# Patient Record
Sex: Female | Born: 2005 | Race: Black or African American | Hispanic: No | Marital: Single | Smoking: Never smoker
Health system: Southern US, Community
[De-identification: ages and names within clinical notes are randomized; demographics above are authoritative.]

## PROBLEM LIST (undated history)

## (undated) DIAGNOSIS — J302 Other seasonal allergic rhinitis: Secondary | ICD-10-CM

## (undated) DIAGNOSIS — J45909 Unspecified asthma, uncomplicated: Secondary | ICD-10-CM

## (undated) HISTORY — PX: BREAST SURGERY: SHX581

---
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
## 2024-01-22 ENCOUNTER — Emergency Department (HOSPITAL_BASED_OUTPATIENT_CLINIC_OR_DEPARTMENT_OTHER)

## 2024-01-22 ENCOUNTER — Other Ambulatory Visit: Payer: Self-pay

## 2024-01-22 ENCOUNTER — Emergency Department (HOSPITAL_BASED_OUTPATIENT_CLINIC_OR_DEPARTMENT_OTHER)
Admission: EM | Admit: 2024-01-22 | Discharge: 2024-01-22 | Disposition: A | Discharge status: Home / Self Care | Attending: Emergency Medicine | Admitting: Emergency Medicine

## 2024-01-22 DIAGNOSIS — R109 Unspecified abdominal pain: Secondary | ICD-10-CM | POA: Diagnosis present

## 2024-01-22 DIAGNOSIS — K5904 Chronic idiopathic constipation: Secondary | ICD-10-CM | POA: Diagnosis not present

## 2024-01-22 DIAGNOSIS — R1031 Right lower quadrant pain: Secondary | ICD-10-CM

## 2024-01-22 LAB — CBC WITH DIFFERENTIAL/PLATELET
Abs Immature Granulocytes: 0.02 K/uL (ref 0.00–0.07)
Basophils Absolute: 0 K/uL (ref 0.0–0.1)
Basophils Relative: 1 %
Eosinophils Absolute: 0.1 K/uL (ref 0.0–0.5)
Eosinophils Relative: 1 %
HCT: 41.1 % (ref 36.0–46.0)
Hemoglobin: 13.5 g/dL (ref 12.0–15.0)
Immature Granulocytes: 0 %
Lymphocytes Relative: 32 %
Lymphs Abs: 2.6 K/uL (ref 0.7–4.0)
MCH: 27.3 pg (ref 26.0–34.0)
MCHC: 32.8 g/dL (ref 30.0–36.0)
MCV: 83.2 fL (ref 80.0–100.0)
Monocytes Absolute: 0.8 K/uL (ref 0.1–1.0)
Monocytes Relative: 10 %
Neutro Abs: 4.7 K/uL (ref 1.7–7.7)
Neutrophils Relative %: 56 %
Platelets: 359 K/uL (ref 150–400)
RBC: 4.94 MIL/uL (ref 3.87–5.11)
RDW: 13.2 % (ref 11.5–15.5)
WBC: 8.3 K/uL (ref 4.0–10.5)
nRBC: 0 % (ref 0.0–0.2)

## 2024-01-22 LAB — URINALYSIS, ROUTINE W REFLEX MICROSCOPIC
Bilirubin Urine: NEGATIVE
Glucose, UA: NEGATIVE mg/dL
Hgb urine dipstick: NEGATIVE
Nitrite: NEGATIVE
Specific Gravity, Urine: 1.029 (ref 1.005–1.030)
pH: 6 (ref 5.0–8.0)

## 2024-01-22 LAB — COMPREHENSIVE METABOLIC PANEL WITH GFR
ALT: 20 U/L (ref 0–44)
AST: 18 U/L (ref 15–41)
Albumin: 4.3 g/dL (ref 3.5–5.0)
Alkaline Phosphatase: 63 U/L (ref 38–126)
Anion gap: 14 (ref 5–15)
BUN: 17 mg/dL (ref 6–20)
CO2: 22 mmol/L (ref 22–32)
Calcium: 9.6 mg/dL (ref 8.9–10.3)
Chloride: 100 mmol/L (ref 98–111)
Creatinine, Ser: 0.76 mg/dL (ref 0.44–1.00)
GFR, Estimated: >60 mL/min (ref >60)
Glucose, Bld: 79 mg/dL (ref 70–99)
Potassium: 3.8 mmol/L (ref 3.5–5.1)
Sodium: 136 mmol/L (ref 135–145)
Total Bilirubin: 0.3 mg/dL (ref 0.0–1.2)
Total Protein: 8.2 g/dL — ABNORMAL HIGH (ref 6.5–8.1)

## 2024-01-22 LAB — URINE DRUG SCREEN
Amphetamines: NEGATIVE
Barbiturates: NEGATIVE
Benzodiazepines: NEGATIVE
Cocaine: NEGATIVE
Fentanyl: NEGATIVE
Methadone Scn, Ur: NEGATIVE
Opiates: NEGATIVE
Tetrahydrocannabinol: NEGATIVE

## 2024-01-22 LAB — ETHANOL: Alcohol, Ethyl (B): <15 mg/dL (ref <15)

## 2024-01-22 LAB — HCG, SERUM, QUALITATIVE: Preg, Serum: NEGATIVE

## 2024-01-22 MED ORDER — MORPHINE SULFATE (PF) 2 MG/ML IV SOLN
2.0000 mg | Freq: Once | INTRAVENOUS | Status: AC
  Administered 2024-01-22: 2 mg via INTRAVENOUS
  Filled 2024-01-22: qty 1

## 2024-01-22 MED ORDER — ONDANSETRON HCL 4 MG/2ML IJ SOLN
4.0000 mg | Freq: Once | INTRAMUSCULAR | Status: AC
  Administered 2024-01-22: 4 mg via INTRAVENOUS
  Filled 2024-01-22: qty 2

## 2024-01-22 MED ORDER — SODIUM CHLORIDE 0.9 % IV BOLUS
1000.0000 mL | Freq: Once | INTRAVENOUS | Status: AC
  Administered 2024-01-22: 1000 mL via INTRAVENOUS

## 2024-01-22 MED ORDER — IOHEXOL 300 MG/ML  SOLN
100.0000 mL | Freq: Once | INTRAMUSCULAR | Status: AC | PRN
  Administered 2024-01-22: 100 mL via INTRAVENOUS

## 2024-01-22 NOTE — ED Notes (Signed)
 Pt given phone charger property of ED. Instructed to return upon D/C.

## 2024-01-22 NOTE — ED Triage Notes (Signed)
 Pt POV reporting lower abd pain and multiple episodes of emesis since yesterday. Last BM 2 days ago.

## 2024-01-22 NOTE — Discharge Instructions (Signed)
 You were seen in the ER today for concerns of abdominal pain. Your labs and imaging were thankfully reassuring with no abnormal findings seen to explain your current pain. Your CT scan did appear to show a somewhat prominent amount of stool on the right lower side of your abdomen which may be why you are having pain in this area. I would suggest taking Miralax daily (one capful) for the next 7 days until bowel movements become more regular and there is improvement in the pain in this area. Afterwards, add in a daily fiber supplement or increase your dietary fiber. For any concerns of worsening symptoms, return to the ER.

## 2024-01-22 NOTE — ED Provider Notes (Signed)
 [place] [facility]PARTMENT AT [facility] Provider Note   CSN: [id number] Arrival date & time: 01/22/24  1535     Patient presents with: Abdominal Pain   Sylvia Oneal is a 18 y.o. female. Patient presents to the ED with concerns of abdominal pain. Reports symptoms ongoing for several weeks with intermittent episodes of vomiting with reported blood streaking in her vomit yesterday. States last bowel movement was two days ago and no recent constipation or diarrhea.   Abdominal Pain      Prior to Admission medications   Medication Sig Start Date End Date Taking? Authorizing Provider  albuterol  (VENTOLIN  HFA) 108 (90 Base) MCG/ACT inhaler Inhale into the lungs. 08/13/21   [provider]  cetirizine (ZYRTEC) 10 MG tablet Take by mouth. 08/13/21   [provider]  clindamycin (CLINDAGEL) 1 % gel Apply to acne bumps on the face once a day and on the boils 2 times a day. 02/14/22   [provider]  doxycycline  (VIBRAMYCIN ) 100 MG capsule Take 1 capsule (100 mg total) by mouth 2 (two) times daily. 11/03/23   Jerrol Agent, MD  ondansetron  (ZOFRAN -ODT) 4 MG disintegrating tablet Take 1 tablet (4 mg total) by mouth every 8 (eight) hours as needed for nausea or vomiting. 09/25/23   Chandra Harlene LABOR, NP    Allergies: Patient has no known allergies.    Review of Systems  Gastrointestinal:  Positive for abdominal pain.  All other systems reviewed and are negative.   Updated Vital Signs BP 92/75   Pulse 89   Temp 98 F (36.7 C) (Oral)   Resp 17   Ht 5' 2 (1.575 m)   LMP 12/06/2023 (Approximate)   SpO2 100%   BMI 31.49 kg/m   Physical Exam Vitals and nursing note reviewed.  Constitutional:      General: She is not in acute distress.    Appearance: She is well-developed.  HENT:     Head: Normocephalic and atraumatic.  Eyes:     Conjunctiva/sclera: Conjunctivae normal.  Cardiovascular:     Rate and Rhythm: Normal rate and regular  rhythm.     Heart sounds: No murmur heard. Pulmonary:     Effort: Pulmonary effort is normal. No respiratory distress.     Breath sounds: Normal breath sounds.  Abdominal:     Palpations: Abdomen is soft.     Tenderness: There is abdominal tenderness in the right lower quadrant. There is guarding. There is no right CVA tenderness, left CVA tenderness or rebound. Negative signs include Murphy's sign.  Musculoskeletal:        General: No swelling.     Cervical back: Neck supple.  Skin:    General: Skin is warm and dry.     Capillary Refill: Capillary refill takes less than 2 seconds.  Neurological:     Mental Status: She is alert.  Psychiatric:        Mood and Affect: Mood normal.     (all labs ordered are listed, but only abnormal results are displayed) Labs Reviewed  COMPREHENSIVE METABOLIC PANEL WITH GFR - Abnormal; Notable for the following components:      Result Value   Total Protein 8.2 (*)    All other components within normal limits  URINALYSIS, ROUTINE W REFLEX MICROSCOPIC - Abnormal; Notable for the following components:   APPearance HAZY (*)    Ketones, ur TRACE (*)    Protein, ur TRACE (*)    Leukocytes,Ua LARGE (*)    Bacteria,  UA RARE (*)    All other components within normal limits  CBC WITH DIFFERENTIAL/PLATELET  ETHANOL  URINE DRUG SCREEN  HCG, SERUM, QUALITATIVE    EKG: None  Radiology: CT ABDOMEN PELVIS W CONTRAST Result Date: 01/22/2024 EXAM: CT ABDOMEN AND PELVIS WITH CONTRAST 01/22/2024 06:29:31 PM TECHNIQUE: CT of the abdomen and pelvis was performed with the administration of 100 mL of iohexol (OMNIPAQUE) 300 MG/ML solution. Multiplanar reformatted images are provided for review. Automated exposure control, iterative reconstruction, and/or weight-based adjustment of the mA/kV was utilized to reduce the radiation dose to as low as reasonably achievable. COMPARISON: None available. CLINICAL HISTORY: RLQ abdominal pain. Triage note: Pt POV reporting  lower abd pain and multiple episodes of emesis since yesterday. Last BM 2 days ago. FINDINGS: LOWER CHEST: No acute abnormality. LIVER: The liver is unremarkable. GALLBLADDER AND BILE DUCTS: Gallbladder is unremarkable. No biliary ductal dilatation. SPLEEN: No acute abnormality. PANCREAS: No acute abnormality. ADRENAL GLANDS: No acute abnormality. KIDNEYS, URETERS AND BLADDER: No stones in the kidneys or ureters. No hydronephrosis. No perinephric or periureteral stranding. Urinary bladder is unremarkable. GI AND BOWEL: Stomach demonstrates no acute abnormality. There is no bowel obstruction. Normal appendix. PERITONEUM AND RETROPERITONEUM: No ascites. No free air. VASCULATURE: Aorta is normal in caliber. LYMPH NODES: No lymphadenopathy. REPRODUCTIVE ORGANS: No acute abnormality. BONES AND SOFT TISSUES: No acute osseous abnormality. No focal soft tissue abnormality. IMPRESSION: 1. No acute abnormalities. Electronically signed by: Norman Gatlin MD 01/22/2024 07:02 PM EDT RP Workstation: [id number]     Procedures   Medications Ordered in the ED  sodium chloride 0.9 % bolus 1,000 mL (0 mLs Intravenous Stopped 01/22/24 1742)  ondansetron  (ZOFRAN ) injection 4 mg (4 mg Intravenous Given 01/22/24 1633)  morphine (PF) 2 MG/ML injection 2 mg (2 mg Intravenous Given 01/22/24 1630)  iohexol (OMNIPAQUE) 300 MG/ML solution 100 mL (100 mLs Intravenous Contrast Given 01/22/24 1823)                                    Medical Decision Making Amount and/or Complexity of Data Reviewed Labs: ordered. Radiology: ordered.  Risk Prescription drug management.   This patient presents to the ED for concern of abdominal pain.  Differential diagnosis includes appendicitis, bowel obstruction, gastroenteritis, dehydration, constipation   Lab Tests:  I Ordered, and personally interpreted labs.  The pertinent results include: CBC unremarkable, CMP unremarkable, UA negative for infection but some bacteria and leukocytes  are seen, UDS negative, hCG negative, ethanol negative   Imaging Studies ordered:  I ordered imaging studies including CT abdomen pelvis I independently visualized and interpreted imaging which showed negative for any acute findings but my interpretation shows some increased stool burden primarily towards the right abdomen. I agree with the radiologist interpretation   Medicines ordered and prescription drug management:  I ordered medication including fluids, Zofran , morphine for your dehydration, nausea, pain Reevaluation of the patient after these medicines showed that the patient improved I have reviewed the patients home medicines and have made adjustments as needed   Problem List / ED Course:  Patient with without significant Eckel history presents to the emergency department concerns abdominal pain.  Reports symptoms have been ongoing for the last several weeks with intermittent episodes of vomiting and reports some blood streaking in her vomit last night.  Denies any hematemesis, medic easier, or melanotic stools.  She is not on blood thinners.  Has been taking some  ibuprofen  intermittently for pain with minimal improvement of pain.  Denies any feelings of weakness, lightheadedness, or syncope. On exam, patient has focal tenderness towards the right lower quadrant.  No suprapubic or left lower quadrant pain.  Normal bowel sounds.  No flank tenderness.  Denies any concerns for pregnancy. Based on exam and history, concern for possible appendicitis.  Will require rule out with labs and imaging.  Symptom management initiated with fluids, Zofran , and morphine.  Patient currently is afebrile, normotensive, and not tachycardic.  So doubtful of sepsis. Patient's workup is largely reassuring with no abnormal lab findings seen.  CT abdomen pelvis negative for any acute findings to explain current symptoms.  On reassessment after medication ministration, she does report improvement in her  symptoms.  There does appear to be somewhat of a moderate stool burden towards the right side of her abdomen so this may be causing her pain.  Advise daily use of MiraLAX to promote healthy bowel movements.  Also advise increase fiber supplementation to reduce straining.  Patient agreeable to current plan.  Return precautions advised.  Otherwise stable for outpatient follow-up and discharged home.   Social Determinants of Health:  None  Final diagnoses:  Chronic idiopathic constipation    ED Discharge Orders     None          Sylvia Legrand LABOR, PA-C 01/22/24 1948    Mannie Pac T, DO 01/28/24 [phone]

## 2024-01-22 NOTE — ED Notes (Signed)
 Reviewed AVS/discharge instructions with patient. Time allotted for and all questions answered. Patient is agreeable for d/c and escorted to ED exit by staff.

## 2024-03-14 ENCOUNTER — Encounter (HOSPITAL_COMMUNITY): Payer: Self-pay

## 2024-03-14 ENCOUNTER — Ambulatory Visit (HOSPITAL_COMMUNITY)
Admission: EM | Admit: 2024-03-14 | Discharge: 2024-03-14 | Disposition: A | Discharge status: Home / Self Care | Attending: Emergency Medicine | Admitting: Emergency Medicine

## 2024-03-14 DIAGNOSIS — Z202 Contact with and (suspected) exposure to infections with a predominantly sexual mode of transmission: Secondary | ICD-10-CM | POA: Insufficient documentation

## 2024-03-14 DIAGNOSIS — N898 Other specified noninflammatory disorders of vagina: Secondary | ICD-10-CM | POA: Diagnosis present

## 2024-03-14 DIAGNOSIS — Z3202 Encounter for pregnancy test, result negative: Secondary | ICD-10-CM | POA: Diagnosis not present

## 2024-03-14 DIAGNOSIS — Z113 Encounter for screening for infections with a predominantly sexual mode of transmission: Secondary | ICD-10-CM | POA: Diagnosis not present

## 2024-03-14 DIAGNOSIS — Z760 Encounter for issue of repeat prescription: Secondary | ICD-10-CM | POA: Diagnosis not present

## 2024-03-14 LAB — POCT URINALYSIS DIP (MANUAL ENTRY)
Bilirubin, UA: NEGATIVE
Glucose, UA: NEGATIVE mg/dL
Ketones, POC UA: NEGATIVE mg/dL
Nitrite, UA: NEGATIVE
Protein Ur, POC: NEGATIVE mg/dL
Spec Grav, UA: >=1.03 — AB (ref 1.010–1.025)
Urobilinogen, UA: 0.2 U/dL
pH, UA: 5.5 (ref 5.0–8.0)

## 2024-03-14 LAB — POCT URINE PREGNANCY: Preg Test, Ur: NEGATIVE

## 2024-03-14 MED ORDER — ALBUTEROL SULFATE HFA 108 (90 BASE) MCG/ACT IN AERS
1.0000 | INHALATION_SPRAY | Freq: Four times a day (QID) | RESPIRATORY_TRACT | 0 refills | Status: AC | PRN
Start: 2024-03-14 — End: ?

## 2024-03-14 MED ORDER — ONDANSETRON 4 MG PO TBDP: 4.0000 mg | ORAL_TABLET | Freq: Three times a day (TID) | ORAL | 0 refills | Status: AC | PRN

## 2024-03-14 MED ORDER — FLUCONAZOLE 150 MG PO TABS: ORAL_TABLET | ORAL | 0 refills | Status: AC

## 2024-03-14 MED ORDER — DOXYCYCLINE HYCLATE 100 MG PO CAPS: 100.0000 mg | ORAL_CAPSULE | Freq: Two times a day (BID) | ORAL | 0 refills | Status: AC

## 2024-03-14 NOTE — ED Provider Notes (Signed)
 [facility]    CSN: [id number] Arrival date & time: 03/14/24  1624      History   Chief Complaint Chief Complaint  Patient presents with   std testing   Abdominal Pain   Vaginal Discharge   Medication Refill    HPI Sylvia Oneal is a 18 y.o. female.   Patient presents with malodorous vaginal discharge for approximately 1-1/2 weeks.  Patient states that she has also had some lower abdominal pain with this.  Patient denies dysuria, hematuria, urinary frequency/urgency, abnormal vaginal bleeding, flank pain, and fever.  LMP 11/11.  Patient states that her recent partner of hers told her that he was exposed to chlamydia and that she should get tested just to be sure.  Patient is requesting Diflucan  if she is prescribed antibiotics for prevention of yeast infection.  Patient is also requesting Zofran  if prescribed doxycycline  specifically as this has caused her to have some nausea in the past.  Patient is also requesting a refill of her albuterol  inhaler for her history of asthma.  Patient denies any recent shortness of breath, chest tightness, wheezing, or cough.  The history is provided by the patient and medical records.  Abdominal Pain Associated symptoms: vaginal discharge   Vaginal Discharge Associated symptoms: abdominal pain   Medication Refill   Past Medical History:  Diagnosis Date   Asthma    Seasonal allergies     There are no active problems to display for this patient.   Past Surgical History:  Procedure Laterality Date   BREAST SURGERY      OB History     Gravida  0   Para  0   Term  0   Preterm  0   AB  0   Living  0      SAB  0   IAB  0   Ectopic  0   Multiple  0   Live Births  0            Home Medications    Prior to Admission medications   Medication Sig Start Date End Date Taking? Authorizing Provider  albuterol  (VENTOLIN  HFA) 108 (90 Base) MCG/ACT inhaler Inhale 1-2 puffs into the lungs every 6  (six) hours as needed for wheezing or shortness of breath. 03/14/24  Yes Johnie, Reef Achterberg A, NP  doxycycline  (VIBRAMYCIN ) 100 MG capsule Take 1 capsule (100 mg total) by mouth 2 (two) times daily for 7 days. 03/14/24 03/21/24 Yes Johnie Flaming A, NP  fluconazole  (DIFLUCAN ) 150 MG tablet Take one tablet today and one tablet in 3 days if symptoms persist. 03/14/24  Yes Johnie Flaming A, NP  ondansetron  (ZOFRAN -ODT) 4 MG disintegrating tablet Take 1 tablet (4 mg total) by mouth every 8 (eight) hours as needed for nausea or vomiting. 03/14/24  Yes Johnie Flaming A, NP  cetirizine (ZYRTEC) 10 MG tablet Take by mouth. 08/13/21   [provider]  clindamycin (CLINDAGEL) 1 % gel Apply to acne bumps on the face once a day and on the boils 2 times a day. Patient not taking: Reported on 03/14/2024 02/14/22   [provider]    Family History Family History  Problem Relation Age of Onset   Hypertension Mother     Social History Social History   Tobacco Use   Smoking status: Never    Passive exposure: Never   Smokeless tobacco: Never  Vaping Use   Vaping status: Never Used  Substance Use Topics   Alcohol use:  Yes   Drug use: Never     Allergies   Patient has no known allergies.   Review of Systems Review of Systems  Gastrointestinal:  Positive for abdominal pain.  Genitourinary:  Positive for vaginal discharge.   Per HPI  Physical Exam Triage Vital Signs ED Triage Vitals  Encounter Vitals Group     BP 03/14/24 1807 123/80     Girls Systolic BP Percentile --      Girls Diastolic BP Percentile --      Boys Systolic BP Percentile --      Boys Diastolic BP Percentile --      Pulse Rate 03/14/24 1807 99     Resp 03/14/24 1807 14     Temp 03/14/24 1807 98.3 F (36.8 C)     Temp Source 03/14/24 1807 Oral     SpO2 03/14/24 1807 98 %     Weight --      Height --      Head Circumference --      Peak Flow --      Pain Score 03/14/24 1806 5     Pain Loc --       Pain Education --      Exclude from Growth Chart --    No data found.  Updated Vital Signs BP 123/80 (BP Location: Right Arm)   Pulse 99   Temp 98.3 F (36.8 C) (Oral)   Resp 14   LMP 03/04/2024 (Approximate)   SpO2 98%   Visual Acuity Right Eye Distance:   Left Eye Distance:   Bilateral Distance:    Right Eye Near:   Left Eye Near:    Bilateral Near:     Physical Exam Vitals and nursing note reviewed.  Constitutional:      General: She is awake. She is not in acute distress.    Appearance: Normal appearance. She is well-developed and well-groomed. She is not ill-appearing.  Abdominal:     General: Abdomen is flat. Bowel sounds are normal.     Palpations: Abdomen is soft.     Tenderness: There is no abdominal tenderness.  Genitourinary:    Comments: Exam deferred Skin:    General: Skin is warm and dry.  Neurological:     Mental Status: She is alert.  Psychiatric:        Behavior: Behavior is cooperative.      UC Treatments / Results  Labs (all labs ordered are listed, but only abnormal results are displayed) Labs Reviewed  POCT URINALYSIS DIP (MANUAL ENTRY) - Abnormal; Notable for the following components:      Result Value   Clarity, UA cloudy (*)    Spec Grav, UA >=1.030 (*)    Blood, UA trace-lysed (*)    Leukocytes, UA Small (1+) (*)    All other components within normal limits  POCT URINE PREGNANCY  CERVICOVAGINAL ANCILLARY ONLY    EKG   Radiology No results found.  Procedures Procedures (including critical care time)  Medications Ordered in UC Medications - No data to display  Initial Impression / Assessment and Plan / UC Course  I have reviewed the triage vital signs and the nursing notes.  Pertinent labs & imaging results that were available during my care of the patient were reviewed by me and considered in my medical decision making (see chart for details).     Patient is overall well-appearing.  Vitals are stable.  GU exam  deferred.  Patient perform self swab for STD/STI.  HIV and RPR declined. Urinalysis did reveal small leukocytes likely related to contamination from vaginal discharge.  UPT negative.  Prescribed doxycycline  for chlamydia coverage due to possible exposure.  Prescribed Diflucan  for prevention of yeast infection with antibiotic use.  Prescribed Zofran  per patient request due to nausea when taking doxycycline  in the past.  Also refilled patient's albuterol  inhaler as requested.  Discussed follow-up and return precautions. Final Clinical Impressions(s) / UC Diagnoses   Final diagnoses:  Vaginal discharge  Exposure to chlamydia  Screen for STD (sexually transmitted disease)  Medication refill     Discharge Instructions      Start taking doxycycline  twice daily for 7 days for chlamydia exposure. I have sent in 2 tablets of Diflucan  as requested due to prescribing you antibiotics today. I have also prescribed Zofran  that you can take every 8 hours as needed for nausea and vomiting related to antibiotic use. Also I refilled your albuterol  inhaler as requested today. Your results will return over the next few days and someone will call if results require any additional treatment. Follow-up with your primary care provider or return here as needed.   ED Prescriptions     Medication Sig Dispense Auth. Provider   doxycycline  (VIBRAMYCIN ) 100 MG capsule Take 1 capsule (100 mg total) by mouth 2 (two) times daily for 7 days. 14 capsule Johnie Flaming A, NP   fluconazole  (DIFLUCAN ) 150 MG tablet Take one tablet today and one tablet in 3 days if symptoms persist. 2 tablet Johnie Flaming A, NP   ondansetron  (ZOFRAN -ODT) 4 MG disintegrating tablet Take 1 tablet (4 mg total) by mouth every 8 (eight) hours as needed for nausea or vomiting. 10 tablet Johnie Flaming A, NP   albuterol  (VENTOLIN  HFA) 108 (90 Base) MCG/ACT inhaler Inhale 1-2 puffs into the lungs every 6 (six) hours as needed for wheezing or  shortness of breath. 18 g Johnie Flaming A, NP      PDMP not reviewed this encounter.   Johnie Flaming A, NP 03/14/24 [phone]

## 2024-03-14 NOTE — ED Triage Notes (Addendum)
 Patient reports tht she has had lower abdominal pain, vaginal odor, and and a brown vaginal discharge with an odor x 1-2 weeks.  Patient states she will need Diflucan  if she is prescribed antibiotics. Patient is also requesting a refill for Albuterol  inhaler.

## 2024-03-14 NOTE — Discharge Instructions (Signed)
 Start taking doxycycline  twice daily for 7 days for chlamydia exposure. I have sent in 2 tablets of Diflucan  as requested due to prescribing you antibiotics today. I have also prescribed Zofran  that you can take every 8 hours as needed for nausea and vomiting related to antibiotic use. Also I refilled your albuterol  inhaler as requested today. Your results will return over the next few days and someone will call if results require any additional treatment. Follow-up with your primary care provider or return here as needed.

## 2024-03-18 ENCOUNTER — Ambulatory Visit (HOSPITAL_COMMUNITY): Payer: Self-pay

## 2024-03-18 LAB — CERVICOVAGINAL ANCILLARY ONLY
Bacterial Vaginitis (gardnerella): POSITIVE — AB
Candida Glabrata: NEGATIVE
Candida Vaginitis: NEGATIVE
Chlamydia: NEGATIVE
Comment: NEGATIVE
Comment: NEGATIVE
Comment: NEGATIVE
Comment: NEGATIVE
Comment: NEGATIVE
Comment: NORMAL
Neisseria Gonorrhea: NEGATIVE
Trichomonas: NEGATIVE

## 2024-03-18 MED ORDER — METRONIDAZOLE 500 MG PO TABS: 500.0000 mg | ORAL_TABLET | Freq: Two times a day (BID) | ORAL | 0 refills | Status: AC

## 2024-03-22 ENCOUNTER — Emergency Department (HOSPITAL_BASED_OUTPATIENT_CLINIC_OR_DEPARTMENT_OTHER): Admitting: Radiology

## 2024-03-22 ENCOUNTER — Encounter (HOSPITAL_BASED_OUTPATIENT_CLINIC_OR_DEPARTMENT_OTHER): Payer: Self-pay

## 2024-03-22 ENCOUNTER — Emergency Department (HOSPITAL_BASED_OUTPATIENT_CLINIC_OR_DEPARTMENT_OTHER)
Admission: EM | Admit: 2024-03-22 | Discharge: 2024-03-22 | Disposition: A | Discharge status: Home / Self Care | Attending: Emergency Medicine | Admitting: Emergency Medicine

## 2024-03-22 ENCOUNTER — Other Ambulatory Visit: Payer: Self-pay

## 2024-03-22 DIAGNOSIS — E871 Hypo-osmolality and hyponatremia: Secondary | ICD-10-CM | POA: Insufficient documentation

## 2024-03-22 DIAGNOSIS — R059 Cough, unspecified: Secondary | ICD-10-CM | POA: Diagnosis present

## 2024-03-22 DIAGNOSIS — J101 Influenza due to other identified influenza virus with other respiratory manifestations: Secondary | ICD-10-CM | POA: Diagnosis not present

## 2024-03-22 LAB — CBC WITH DIFFERENTIAL/PLATELET
Abs Immature Granulocytes: 0.04 K/uL (ref 0.00–0.07)
Basophils Absolute: 0 K/uL (ref 0.0–0.1)
Basophils Relative: 1 %
Eosinophils Absolute: 0 K/uL (ref 0.0–0.5)
Eosinophils Relative: 0 %
HCT: 38.6 % (ref 36.0–46.0)
Hemoglobin: 12.8 g/dL (ref 12.0–15.0)
Immature Granulocytes: 1 %
Lymphocytes Relative: 21 %
Lymphs Abs: 1.3 K/uL (ref 0.7–4.0)
MCH: 26.7 pg (ref 26.0–34.0)
MCHC: 33.2 g/dL (ref 30.0–36.0)
MCV: 80.4 fL (ref 80.0–100.0)
Monocytes Absolute: 1.1 K/uL — ABNORMAL HIGH (ref 0.1–1.0)
Monocytes Relative: 18 %
Neutro Abs: 3.5 K/uL (ref 1.7–7.7)
Neutrophils Relative %: 59 %
Platelets: 322 K/uL (ref 150–400)
RBC: 4.8 MIL/uL (ref 3.87–5.11)
RDW: 13.2 % (ref 11.5–15.5)
WBC: 5.9 K/uL (ref 4.0–10.5)
nRBC: 0 % (ref 0.0–0.2)

## 2024-03-22 LAB — BASIC METABOLIC PANEL WITH GFR
Anion gap: 14 (ref 5–15)
BUN: 13 mg/dL (ref 6–20)
CO2: 22 mmol/L (ref 22–32)
Calcium: 10.2 mg/dL (ref 8.9–10.3)
Chloride: 98 mmol/L (ref 98–111)
Creatinine, Ser: 0.76 mg/dL (ref 0.44–1.00)
GFR, Estimated: >60 mL/min (ref >60)
Glucose, Bld: 98 mg/dL (ref 70–99)
Potassium: 3.6 mmol/L (ref 3.5–5.1)
Sodium: 134 mmol/L — ABNORMAL LOW (ref 135–145)

## 2024-03-22 LAB — RESP PANEL BY RT-PCR (RSV, FLU A&B, COVID)  RVPGX2
Influenza A by PCR: POSITIVE — AB
Influenza B by PCR: NEGATIVE
Resp Syncytial Virus by PCR: NEGATIVE
SARS Coronavirus 2 by RT PCR: NEGATIVE

## 2024-03-22 LAB — HCG, SERUM, QUALITATIVE: Preg, Serum: NEGATIVE

## 2024-03-22 LAB — D-DIMER, QUANTITATIVE: D-Dimer, Quant: 0.47 ug{FEU}/mL (ref 0.00–0.50)

## 2024-03-22 MED ORDER — KETOROLAC TROMETHAMINE 15 MG/ML IJ SOLN
15.0000 mg | Freq: Once | INTRAMUSCULAR | Status: AC
  Administered 2024-03-22: 15 mg via INTRAVENOUS
  Filled 2024-03-22: qty 1

## 2024-03-22 MED ORDER — SODIUM CHLORIDE 0.9 % IV BOLUS
1000.0000 mL | Freq: Once | INTRAVENOUS | Status: AC
  Administered 2024-03-22: 1000 mL via INTRAVENOUS

## 2024-03-22 NOTE — ED Provider Notes (Addendum)
 [place] EMERGENCY DEPARTMENT AT [facility] Provider Note   CSN: [id number] Arrival date & time: 03/22/24  0400     Patient presents with: Generalized Body Aches   Sylvia Oneal is a 18 y.o. female.   HPI   18 year old female presenting to the emergency department with a chief complaint of flulike symptoms for the past 2 weeks.  The patient endorses generalized bodyaches.  She states that she has had a cough with some associated shortness of breath during that time.  No fevers.  She does endorse chills.  No abdominal pain, no genitourinary symptoms.  Prior to Admission medications   Medication Sig Start Date End Date Taking? Authorizing Provider  albuterol  (VENTOLIN  HFA) 108 (90 Base) MCG/ACT inhaler Inhale 1-2 puffs into the lungs every 6 (six) hours as needed for wheezing or shortness of breath. 03/14/24   Johnie Flaming A, NP  cetirizine (ZYRTEC) 10 MG tablet Take by mouth. 08/13/21   [provider]  clindamycin (CLINDAGEL) 1 % gel Apply to acne bumps on the face once a day and on the boils 2 times a day. Patient not taking: Reported on 03/14/2024 02/14/22   [provider]  fluconazole  (DIFLUCAN ) 150 MG tablet Take one tablet today and one tablet in 3 days if symptoms persist. 03/14/24   Johnie Flaming A, NP  metroNIDAZOLE  (FLAGYL ) 500 MG tablet Take 1 tablet (500 mg total) by mouth 2 (two) times daily for 7 days. 03/18/24 03/25/24  Vonna Sharlet POUR, MD  ondansetron  (ZOFRAN -ODT) 4 MG disintegrating tablet Take 1 tablet (4 mg total) by mouth every 8 (eight) hours as needed for nausea or vomiting. 03/14/24   Johnie Flaming LABOR, NP    Allergies: Patient has no known allergies.    Review of Systems  All other systems reviewed and are negative.   Updated Vital Signs BP 129/85 (BP Location: Right Arm)   Pulse (!) 109   Temp 98.2 F (36.8 C) (Oral)   Resp 17   Ht 5' 2 (1.575 m)   Wt 79.4 kg   LMP 03/04/2024 (Exact Date)   SpO2 98%    BMI 32.01 kg/m   Physical Exam Vitals and nursing note reviewed.  Constitutional:      General: She is not in acute distress.    Appearance: She is well-developed. She is obese.  HENT:     Head: Normocephalic and atraumatic.  Eyes:     Conjunctiva/sclera: Conjunctivae normal.  Cardiovascular:     Rate and Rhythm: Regular rhythm. Tachycardia present.  Pulmonary:     Effort: Pulmonary effort is normal. No respiratory distress.     Breath sounds: Normal breath sounds.  Abdominal:     Palpations: Abdomen is soft.     Tenderness: There is no abdominal tenderness.  Musculoskeletal:        General: No swelling.     Cervical back: Neck supple.  Skin:    General: Skin is warm and dry.     Capillary Refill: Capillary refill takes less than 2 seconds.  Neurological:     Mental Status: She is alert.  Psychiatric:        Mood and Affect: Mood normal.     (all labs ordered are listed, but only abnormal results are displayed) Labs Reviewed  RESP PANEL BY RT-PCR (RSV, FLU A&B, COVID)  RVPGX2 - Abnormal; Notable for the following components:      Result Value   Influenza A by PCR POSITIVE (*)    All other  components within normal limits  CBC WITH DIFFERENTIAL/PLATELET - Abnormal; Notable for the following components:   Monocytes Absolute 1.1 (*)    All other components within normal limits  BASIC METABOLIC PANEL WITH GFR - Abnormal; Notable for the following components:   Sodium 134 (*)    All other components within normal limits  D-DIMER, QUANTITATIVE  HCG, SERUM, QUALITATIVE    EKG: None  Radiology: DG Chest 2 View Result Date: 03/22/2024 EXAM: 2 VIEW(S) XRAY OF THE CHEST 03/22/2024 05:16:00 AM COMPARISON: PA and lateral radiographs of the chest dated 10/04/2018. CLINICAL HISTORY: cough 2 weeks FINDINGS: LUNGS AND PLEURA: No focal pulmonary opacity. No pleural effusion. No pneumothorax. HEART AND MEDIASTINUM: No acute abnormality of the cardiac and mediastinal silhouettes.  BONES AND SOFT TISSUES: No acute osseous abnormality. IMPRESSION: 1. No acute process. Electronically signed by: Evalene Coho MD 03/22/2024 05:19 AM EST RP Workstation: [id number]     Procedures   Medications Ordered in the ED  sodium chloride  0.9 % bolus 1,000 mL (1,000 mLs Intravenous New Bag/Given 03/22/24 0456)    Clinical Course as of 03/22/24 0535  Sat Mar 22, 2024  0458 Influenza A By PCR(!): POSITIVE [JL]    Clinical Course User Index [JL] Jerrol Agent, MD                                 Medical Decision Making Amount and/or Complexity of Data Reviewed Labs: ordered. Decision-making details documented in ED Course. Radiology: ordered.    18 year old female presenting to the emergency department with a chief complaint of flulike symptoms for the past 2 weeks.  The patient endorses generalized bodyaches.  She states that she has had a cough with some associated shortness of breath during that time.  No fevers.  She does endorse chills.  No abdominal pain, no genitourinary symptoms.  On arrival, the patient was afebrile, tachycardic heart rate 109, not tachypneic RR 17, BP 129/85, saturating 98% on room air.  On exam, the patient was found to be a well-appearing obese female in no distress, tachycardia noted with clear lungs.  Considered viral syndrome, bacterial pneumonia, pulmonary embolism, dehydration.  Labs and x-ray imaging were obtained and the patient was administered an IV fluid bolus in the setting of her tachycardia.  Labs: Mild hyponatremia to 134, CBC without leukocytosis or anemia, D-dimer negative, hCG negative, PCR testing negative for COVID, positive for influenza A, negative for RSV.  CXR: No acute cardiopulmonary process.  Patient reassessed, overall vitally stable on repeat assessment. Outside the window for Tamiflu.  Informed of the diagnosis of influenza.  Overall stable for discharge at this time, advised rest, NSAIDs and Tylenol  for muscle aches  and fever, continue oral rehydration outpatient.  Patient overall stable for discharge and continued outpatient management.     Final diagnoses:  Influenza A    ED Discharge Orders     None          Jerrol Agent, MD 03/22/24 9464    Jerrol Agent, MD 03/22/24 [phone]

## 2024-03-22 NOTE — Discharge Instructions (Signed)
 You tested positive for the flu.  You were outside the window for Tamiflu.  Recommend continued outpatient symptomatic management with Tylenol  and Motrin  for muscle aches, continue oral rehydration.

## 2024-03-22 NOTE — ED Triage Notes (Signed)
 Pt to exam 13 c/o flu like symptoms with body aches  x 2 weeks. Pt denies SOB CP at this time. Pt states she has been on prednisone for 3 days.  VSS NAD PT on room air.

## 2024-04-23 ENCOUNTER — Other Ambulatory Visit: Payer: Self-pay

## 2024-04-23 ENCOUNTER — Emergency Department (HOSPITAL_BASED_OUTPATIENT_CLINIC_OR_DEPARTMENT_OTHER)
Admission: EM | Admit: 2024-04-23 | Discharge: 2024-04-23 | Disposition: A | Discharge status: Home / Self Care | Source: Home / Self Care | Attending: Emergency Medicine | Admitting: Emergency Medicine

## 2024-04-23 ENCOUNTER — Encounter (HOSPITAL_BASED_OUTPATIENT_CLINIC_OR_DEPARTMENT_OTHER): Payer: Self-pay

## 2024-04-23 DIAGNOSIS — R509 Fever, unspecified: Secondary | ICD-10-CM | POA: Diagnosis not present

## 2024-04-23 DIAGNOSIS — R059 Cough, unspecified: Secondary | ICD-10-CM | POA: Insufficient documentation

## 2024-04-23 DIAGNOSIS — M791 Myalgia, unspecified site: Secondary | ICD-10-CM | POA: Insufficient documentation

## 2024-04-23 DIAGNOSIS — R6889 Other general symptoms and signs: Secondary | ICD-10-CM

## 2024-04-23 MED ORDER — HYDROCOD POLI-CHLORPHE POLI ER 10-8 MG/5ML PO SUER: 5.0000 mL | Freq: Every evening | ORAL | 0 refills | Status: DC | PRN

## 2024-04-23 MED ORDER — HYDROCOD POLI-CHLORPHE POLI ER 10-8 MG/5ML PO SUER: 5.0000 mL | Freq: Every evening | ORAL | 0 refills | Status: AC | PRN

## 2024-04-23 NOTE — Discharge Instructions (Signed)
 As we discussed, use your inhaler every 4-6 hours to help with cough and any shortness of breath. Take Tylenol  and/or ibuprofen  for any chills, aches. Use Tussionex cough syrup at night for better rest.   Return to the ED with any worsening symptoms or new concerns. Follow up with your doctor as needed.

## 2024-04-23 NOTE — ED Notes (Signed)
 Ambulates around unit with pulse oximeter. Upon returning to bed 102 HR and SpO2 98%. Does not appear winded or in distress.

## 2024-04-23 NOTE — ED Triage Notes (Signed)
 Pt reports flu like s/s x2 days.

## 2024-04-23 NOTE — ED Provider Notes (Addendum)
 "  EMERGENCY DEPARTMENT AT [facility] Provider Note   CSN: [id number] Arrival date & time: 04/23/24  1227     Patient presents with: Influenza   Sylvia Oneal is a 18 y.o. female.   Patient to ED for evaluation of generalized body aches, cough, fever Tmax 102 at home x 2-3 days. She has a history of asthma and reports some shortness of breath no better with inhaler use. No vomiting.   The history is provided by the patient. No language interpreter was used.  Influenza      Prior to Admission medications  Medication Sig Start Date End Date Taking? Authorizing Provider  chlorpheniramine-HYDROcodone (TUSSIONEX) 10-8 MG/5ML Take 5 mLs by mouth at bedtime as needed for cough. 04/23/24  Yes Ferry Matthis, Margit, PA-C  albuterol  (VENTOLIN  HFA) 108 (90 Base) MCG/ACT inhaler Inhale 1-2 puffs into the lungs every 6 (six) hours as needed for wheezing or shortness of breath. 03/14/24   Johnie Flaming A, NP  cetirizine (ZYRTEC) 10 MG tablet Take by mouth. 08/13/21   [provider]  clindamycin (CLINDAGEL) 1 % gel Apply to acne bumps on the face once a day and on the boils 2 times a day. Patient not taking: Reported on 03/14/2024 02/14/22   [provider]  fluconazole  (DIFLUCAN ) 150 MG tablet Take one tablet today and one tablet in 3 days if symptoms persist. 03/14/24   Johnie Flaming A, NP  ondansetron  (ZOFRAN -ODT) 4 MG disintegrating tablet Take 1 tablet (4 mg total) by mouth every 8 (eight) hours as needed for nausea or vomiting. 03/14/24   Johnie Flaming LABOR, NP    Allergies: Patient has no known allergies.    Review of Systems  Updated Vital Signs BP 133/84   Pulse 90   Temp 98.2 F (36.8 C) (Oral)   Resp 18   Ht 5' 2 (1.575 m)   Wt 77.1 kg   SpO2 99%   BMI 31.09 kg/m   Physical Exam Vitals and nursing note reviewed.  Constitutional:      Appearance: She is well-developed.  HENT:     Head: Normocephalic.  Cardiovascular:     Rate  and Rhythm: Normal rate and regular rhythm.     Heart sounds: No murmur heard. Pulmonary:     Effort: Pulmonary effort is normal.     Breath sounds: Normal breath sounds. No wheezing, rhonchi or rales.  Abdominal:     Palpations: Abdomen is soft.     Tenderness: There is no abdominal tenderness. There is no guarding or rebound.  Musculoskeletal:        General: Normal range of motion.     Cervical back: Normal range of motion and neck supple.  Skin:    General: Skin is warm and dry.  Neurological:     General: No focal deficit present.     Mental Status: She is alert and oriented to person, place, and time.     (all labs ordered are listed, but only abnormal results are displayed) Labs Reviewed - No data to display  EKG: None  Radiology: No results found.   Procedures   Medications Ordered in the ED - No data to display  Clinical Course as of 04/23/24 1700  Wed Apr 23, 2024  1634 Patient to ED with ss/sxs per HPI. Very well appearing. Afebrile in ED. Initially tachycardic that resolves in ED without recurrence when ambulated. No drop in Oxygen level. Lung sounds very clear, doubt PNA. Symptoms like viral requiring supportive  care.  [SU]    Clinical Course User Index [SU] Odell Balls, PA-C                                 Medical Decision Making Risk Prescription drug management.        Final diagnoses:  Flu-like symptoms    ED Discharge Orders          Ordered    chlorpheniramine-HYDROcodone (TUSSIONEX) 10-8 MG/5ML  At bedtime PRN        04/23/24 1658               Odell Balls, PA-C 04/23/24 1700    Odell Balls, PA-C 04/23/24 1754    Lenor Hollering, MD 04/23/24 2124  "
